# Patient Record
Sex: Male | Born: 1955 | Race: Black or African American | Hispanic: No | Marital: Single | Smoking: Current every day smoker
Health system: Southern US, Community
[De-identification: ages and names within clinical notes are randomized; demographics above are authoritative.]

## PROBLEM LIST (undated history)

## (undated) DIAGNOSIS — D649 Anemia, unspecified: Secondary | ICD-10-CM

## (undated) DIAGNOSIS — F209 Schizophrenia, unspecified: Secondary | ICD-10-CM

## (undated) DIAGNOSIS — J42 Unspecified chronic bronchitis: Secondary | ICD-10-CM

## (undated) HISTORY — PX: KNEE SURGERY: SHX244

## (undated) HISTORY — DX: Unspecified chronic bronchitis: J42

## (undated) HISTORY — DX: Anemia, unspecified: D64.9

## (undated) HISTORY — DX: Schizophrenia, unspecified: F20.9

---

## 2020-10-05 ENCOUNTER — Other Ambulatory Visit: Payer: Self-pay

## 2020-10-05 ENCOUNTER — Ambulatory Visit (HOSPITAL_COMMUNITY)
Admission: RE | Admit: 2020-10-05 | Discharge: 2020-10-05 | Disposition: A | Discharge status: Home / Self Care | Payer: Medicare Other | Source: Ambulatory Visit | Attending: Gerontology | Admitting: Gerontology

## 2020-10-05 ENCOUNTER — Other Ambulatory Visit (HOSPITAL_COMMUNITY): Payer: Self-pay | Admitting: Gerontology

## 2020-10-05 DIAGNOSIS — J410 Simple chronic bronchitis: Secondary | ICD-10-CM | POA: Diagnosis not present

## 2020-11-06 NOTE — Addendum Note (Signed)
Encounter addended by: Novella Olive on: 11/06/2020 4:26 PM  Actions taken: Letter saved

## 2020-11-11 ENCOUNTER — Encounter: Payer: Self-pay | Admitting: Internal Medicine

## 2020-11-12 ENCOUNTER — Other Ambulatory Visit (HOSPITAL_COMMUNITY): Payer: Self-pay | Admitting: Gerontology

## 2020-11-12 ENCOUNTER — Other Ambulatory Visit: Payer: Self-pay | Admitting: Gerontology

## 2020-11-12 DIAGNOSIS — R9389 Abnormal findings on diagnostic imaging of other specified body structures: Secondary | ICD-10-CM

## 2020-11-17 ENCOUNTER — Ambulatory Visit (HOSPITAL_BASED_OUTPATIENT_CLINIC_OR_DEPARTMENT_OTHER): Payer: Medicare Other

## 2020-11-23 ENCOUNTER — Ambulatory Visit (HOSPITAL_BASED_OUTPATIENT_CLINIC_OR_DEPARTMENT_OTHER)
Admission: RE | Admit: 2020-11-23 | Discharge: 2020-11-23 | Disposition: A | Discharge status: Home / Self Care | Payer: Medicare Other | Source: Ambulatory Visit | Attending: Gerontology | Admitting: Gerontology

## 2020-11-23 ENCOUNTER — Other Ambulatory Visit: Payer: Self-pay

## 2020-11-23 DIAGNOSIS — R9389 Abnormal findings on diagnostic imaging of other specified body structures: Secondary | ICD-10-CM

## 2020-11-23 DIAGNOSIS — R911 Solitary pulmonary nodule: Secondary | ICD-10-CM | POA: Diagnosis not present

## 2020-12-23 ENCOUNTER — Other Ambulatory Visit: Payer: Self-pay

## 2020-12-23 ENCOUNTER — Encounter: Payer: Self-pay | Admitting: Nurse Practitioner

## 2020-12-23 ENCOUNTER — Telehealth: Payer: Self-pay

## 2020-12-23 ENCOUNTER — Ambulatory Visit (INDEPENDENT_AMBULATORY_CARE_PROVIDER_SITE_OTHER): Payer: Medicare Other | Admitting: Nurse Practitioner

## 2020-12-23 DIAGNOSIS — Z79899 Other long term (current) drug therapy: Secondary | ICD-10-CM

## 2020-12-23 DIAGNOSIS — Z1211 Encounter for screening for malignant neoplasm of colon: Secondary | ICD-10-CM | POA: Diagnosis not present

## 2020-12-23 DIAGNOSIS — Z01818 Encounter for other preprocedural examination: Secondary | ICD-10-CM | POA: Diagnosis not present

## 2020-12-23 DIAGNOSIS — Z7982 Long term (current) use of aspirin: Secondary | ICD-10-CM

## 2020-12-23 DIAGNOSIS — Z0000 Encounter for general adult medical examination without abnormal findings: Secondary | ICD-10-CM

## 2020-12-23 MED ORDER — PEG 3350-KCL-NA BICARB-NACL 420 G PO SOLR: 4000.0000 mL | ORAL | 0 refills | Status: AC

## 2020-12-23 NOTE — Patient Instructions (Signed)
Your health issues we discussed today were:   Need for colonoscopy: 1. We will schedule your colonoscopy for you 2. Further recommendations will follow your colonoscopy 3. Call if there is any questions or problems with the bowel prep  Overall I recommend:  1. Continue other current medications 2. Return for follow-up based on recommendations made after your procedure, or as needed for GI symptoms 3. Call us if you have any questions or concerns   ---------------------------------------------------------------  I am glad you have gotten your COVID-19 vaccination!  Even though you are fully vaccinated you should continue to follow CDC and state/local guidelines.  ---------------------------------------------------------------   At [facility] we value your feedback. You may receive a survey about your visit today. Please share your experience as we strive to create trusting relationships with our patients to provide genuine, compassionate, quality care.  We appreciate your understanding and patience as we review any laboratory studies, imaging, and other diagnostic tests that are ordered as we care for you. Our office policy is 5 business days for review of these results, and any emergent or urgent results are addressed in a timely manner for your best interest. If you do not hear from our office in 1 week, please contact us.   We also encourage the use of MyChart, which contains your medical information for your review as well. If you are not enrolled in this feature, an access code is on this after visit summary for your convenience. Thank you for allowing [place] to be involved in your care.  It was great to see you today!  I hope you have a great summer!!

## 2020-12-23 NOTE — Progress Notes (Signed)
Cc'ed to pcp °

## 2020-12-23 NOTE — Telephone Encounter (Signed)
Called [facility], spoke to [place], [place] w/Propofol ASA 2 with Dr. Marletta Lor scheduled for 01/28/21 at 11:00am. Pt to arrive at 8:00am for rapid covid test prior to procedure d/t being a facility patient. Rx for prep sent to pharmacy. Orders entered. Procedure instructions faxed to [facility] (fax# [phone]).

## 2020-12-23 NOTE — Progress Notes (Signed)
Primary Care Physician:  Louis Fire, MD Primary Gastroenterologist:  Dr. Abbey Chatters  Chief Complaint  Patient presents with  . Consult    Never had TCS done prior.    HPI:   Louis Riley is a 65 y.o. male who presents on referral from primary care to schedule colonoscopy.  Nurse/phone triage was deferred office visit due to medications likely necessitating augmented sedation.  No history of colonoscopy found in our system.  Today he states he is doing okay overall. Denies abdominal pain, N/V, hematochezia, melena, fever, chills, unintentional weight loss. Denies URI or flu-like symptoms. Denies loss of sense of taste or smell. The patient has received COVID-19 vaccination(s). Denies chest pain, dyspnea, dizziness, lightheadedness, syncope, near syncope. Denies any other upper or lower GI symptoms.   Past Medical History:  Diagnosis Date  . Anemia   . Chronic bronchitis ([place])   . Schizophrenia ([place])     History reviewed. No pertinent surgical history.  Current Outpatient Medications  Medication Sig Dispense Refill  . ASPIRIN LOW DOSE 81 MG EC tablet Take 81 mg by mouth daily.    . benztropine (COGENTIN) 1 MG tablet Take 1 mg by mouth 2 (two) times daily.    . brimonidine (ALPHAGAN) 0.2 % ophthalmic solution 1 drop both eyes twice a day    . FEROSUL 325 (65 Fe) MG tablet Take by mouth. 1 tablet daily    . haloperidol (HALDOL) 2 MG/ML solution Take by mouth. 75m (280m by mouth 3 times a day    . melatonin 5 MG TABS Take 5 mg by mouth at bedtime.    . risperidone (RISPERDAL) 4 MG tablet Take 4 mg by mouth at bedtime.    . VENTOLIN HFA 108 (90 Base) MCG/ACT inhaler Inhale 2 puffs into the lungs every 4 (four) hours as needed.     No current facility-administered medications for this visit.    Allergies as of 12/23/2020  . (No Known Allergies)    History reviewed. No pertinent family history.  Social History   Socioeconomic History  . Marital status: Single     Spouse name: Not on file  . Number of children: Not on file  . Years of education: Not on file  . Highest education level: Not on file  Occupational History  . Not on file  Tobacco Use  . Smoking status: Current Every Day Smoker    Packs/day: 0.25    Types: Cigarettes  . Smokeless tobacco: Never Used  Substance and Sexual Activity  . Alcohol use: Never  . Drug use: Never  . Sexual activity: Not on file  Other Topics Concern  . Not on file  Social History Narrative  . Not on file   Social Determinants of Health   Financial Resource Strain: Not on file  Food Insecurity: Not on file  Transportation Needs: Not on file  Physical Activity: Not on file  Stress: Not on file  Social Connections: Not on file  Intimate Partner Violence: Not on file    Subjective: Review of Systems  Constitutional: Negative for chills, fever, malaise/fatigue and weight loss.  HENT: Negative for congestion and sore throat.   Respiratory: Negative for cough and shortness of breath.   Cardiovascular: Negative for chest pain and palpitations.  Gastrointestinal: Negative for abdominal pain, blood in stool, constipation, diarrhea, heartburn, melena, nausea and vomiting.  Musculoskeletal: Negative for joint pain and myalgias.  Skin: Negative for rash.  Neurological: Negative for dizziness and weakness.  Endo/Heme/Allergies: Does  not bruise/bleed easily.  Psychiatric/Behavioral: Negative for depression. The patient is not nervous/anxious.   All other systems reviewed and are negative.      Objective: BP 92/68   Pulse (!) 103   Temp (!) 96.9 F (36.1 C)   Ht _0  (1.88 m)   Wt 166 lb 3.2 oz (75.4 kg)   BMI 21.34 kg/m  Physical Exam Vitals and nursing note reviewed.  Constitutional:      General: He is not in acute distress.    Appearance: Normal appearance. He is normal weight. He is not ill-appearing, toxic-appearing or diaphoretic.  HENT:     Head: Normocephalic and atraumatic.     Nose:  No congestion or rhinorrhea.  Eyes:     General: No scleral icterus. Cardiovascular:     Rate and Rhythm: Normal rate and regular rhythm.     Heart sounds: Normal heart sounds.  Pulmonary:     Effort: Pulmonary effort is normal.     Breath sounds: Normal breath sounds.  Abdominal:     General: Bowel sounds are normal. There is no distension.     Palpations: Abdomen is soft. There is no hepatomegaly, splenomegaly or mass.     Tenderness: There is no abdominal tenderness. There is no guarding or rebound.     Hernia: No hernia is present.  Musculoskeletal:     Cervical back: Neck supple.  Skin:    General: Skin is warm and dry.     Coloration: Skin is not jaundiced.     Findings: No bruising or rash.  Neurological:     General: No focal deficit present.     Mental Status: He is alert and oriented to person, place, and time. Mental status is at baseline.  Psychiatric:        Mood and Affect: Mood normal.        Speech: Speech is rapid and pressured.        Behavior: Behavior normal.        Thought Content: Thought content normal.     Comments: Pleasant male, difficult to understand speech      Assessment:  Very pleasant 65 year old male who presents on referral from primary care to schedule colonoscopy.  History of schizophrenia currently living at [facility].  He is generally asymptomatic from a GI standpoint.  Due to medications we will need to proceed with propofol.  Further recommendations will follow his colonoscopy   Proceed with TCS on propofol/MAC with Dr. Abbey Chatters on propofol/MAC in near future: the risks, benefits, and alternatives have been discussed with the patient in detail. The patient states understanding and desires to proceed.  ASA II  The patient is currently on iron which we will hold for 1 week prior to his procedure.   Plan: 1. Colonoscopy as described above 2. Further recommendations to follow colonoscopy 3. Follow-up based on post  procedure recommendations or as needed for GI symptoms    Thank you for allowing [place] to participate in the care of [facility]  [name], DNP, AGNP-C Adult & Gerontological Nurse Practitioner [facility]   12/23/2020 10:34 AM   Disclaimer: This note was dictated with voice recognition software. Similar sounding words can inadvertently be transcribed and may not be corrected upon review.

## 2021-01-26 ENCOUNTER — Telehealth: Payer: Self-pay | Admitting: *Deleted

## 2021-01-26 NOTE — Progress Notes (Signed)
Called [facility] to notify them that patient does not need to arrive early for a covid test. Stacy at [facility] said they need to cancel procedure as they did not stop patient's iron. Mindy at [facility] notified.

## 2021-01-26 NOTE — Telephone Encounter (Signed)
Received VM from [place] that patient's Iron was not held for procedure scheduled for Friday 6/3 with Dr. Marletta Lor.  Called Nettie Elm at [phone] but line rings busy multiple times. Endo aware to place orders in depot for now.

## 2021-01-27 NOTE — Telephone Encounter (Signed)
Called back and line rang multiple times, no answer.

## 2021-01-28 ENCOUNTER — Other Ambulatory Visit (HOSPITAL_COMMUNITY): Admission: RE | Admit: 2021-01-28 | Payer: Medicare Other | Source: Ambulatory Visit

## 2021-01-28 ENCOUNTER — Ambulatory Visit (HOSPITAL_COMMUNITY): Admission: RE | Admit: 2021-01-28 | Payer: Medicare Other | Source: Home / Self Care

## 2021-01-28 ENCOUNTER — Telehealth: Payer: Self-pay | Admitting: Internal Medicine

## 2021-01-28 ENCOUNTER — Encounter (HOSPITAL_COMMUNITY): Admission: RE | Payer: Self-pay | Source: Home / Self Care

## 2021-01-28 SURGERY — COLONOSCOPY WITH PROPOFOL
Anesthesia: Monitor Anesthesia Care

## 2021-01-28 NOTE — Telephone Encounter (Signed)
Nettie Elm from [facility] called to schedule patient's procedure. Please call [phone]

## 2021-01-31 NOTE — Telephone Encounter (Signed)
See other phone note for today. 

## 2021-01-31 NOTE — Telephone Encounter (Signed)
Called [facility], [place] wasn't available. Spoke to [place]. TCS rescheduled to 02/21/21 at 9:15am. Facility has prep. Instructions faxed to [phone]. Orders entered.

## 2021-02-18 ENCOUNTER — Encounter (HOSPITAL_COMMUNITY): Payer: Self-pay | Admitting: Anesthesiology

## 2021-02-21 ENCOUNTER — Telehealth: Payer: Self-pay | Admitting: Internal Medicine

## 2021-02-21 ENCOUNTER — Ambulatory Visit (HOSPITAL_COMMUNITY): Admission: RE | Admit: 2021-02-21 | Payer: Medicare Other | Source: Home / Self Care

## 2021-02-21 ENCOUNTER — Encounter (HOSPITAL_COMMUNITY): Admission: RE | Payer: Self-pay | Source: Home / Self Care

## 2021-02-21 SURGERY — COLONOSCOPY WITH PROPOFOL
Anesthesia: Monitor Anesthesia Care

## 2021-02-21 NOTE — Telephone Encounter (Signed)
Noted. Wynne Dust NP seen pt at [facility]. Eric no longer with our practice.

## 2021-02-21 NOTE — Telephone Encounter (Signed)
Melanie from [facility] called to let us know that the patient was refusing his procedure this morning and it was cancelled.

## 2021-04-05 IMAGING — CT CT CHEST W/O CM
1 of 2 series · 14 of 32 positions shown, 18 images · non-contrast
Comparison: September 2020.

CLINICAL DATA: Abnormal chest x-ray.

EXAM:
CT CHEST WITHOUT CONTRAST
TECHNIQUE: Multidetector CT imaging of the chest was performed following the
standard protocol without IV contrast.

[Series 2: routine chest without · axial · non-contrast · 0.63mm/px · z∈[+1224,+1518]mm · 14 of 175 slices shown, 18 images]
[im 14/175  mediastinal]
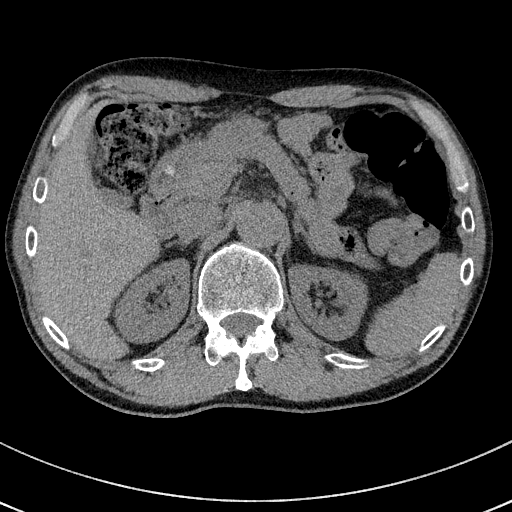
[im 14/175  lung]
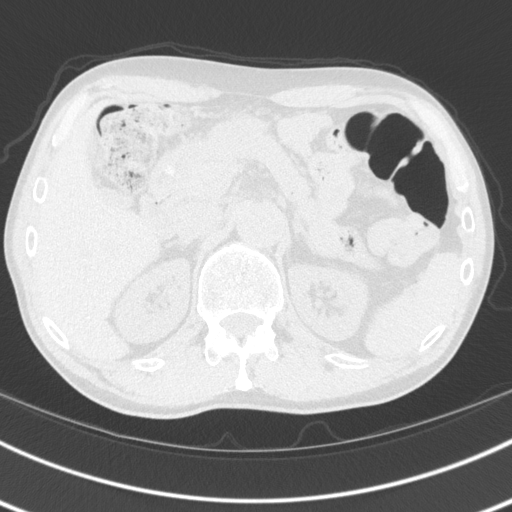
[im 27/175  lung]
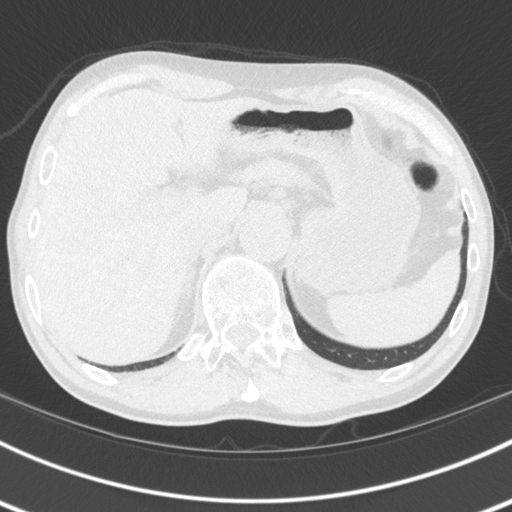
[im 41/175  lung]
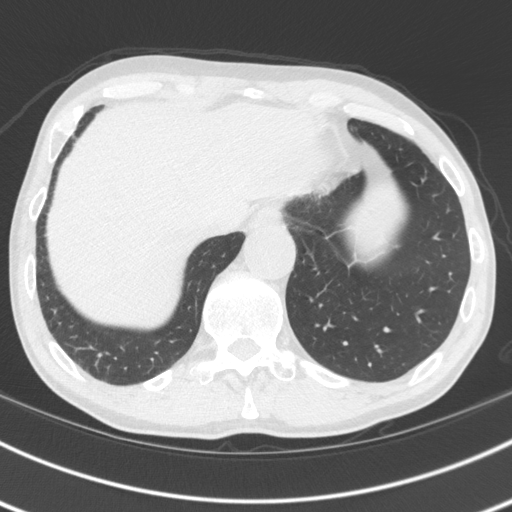
[im 54/175  lung]
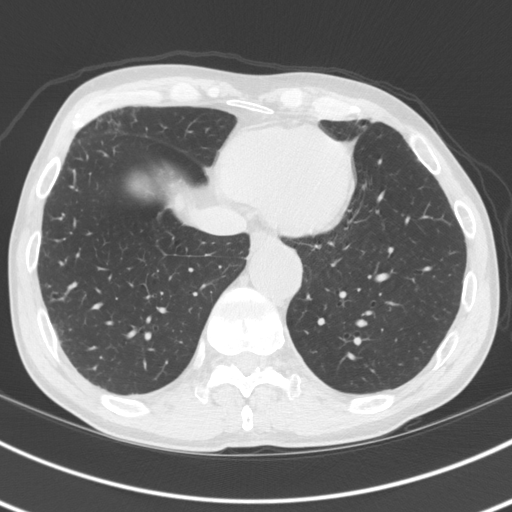
[im 67/175  mediastinal]
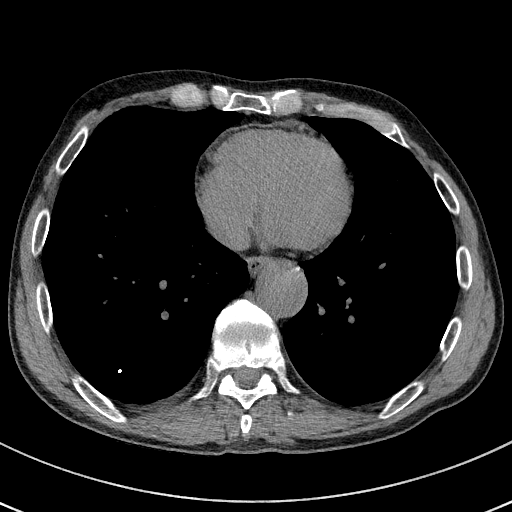
[im 67/175  lung]
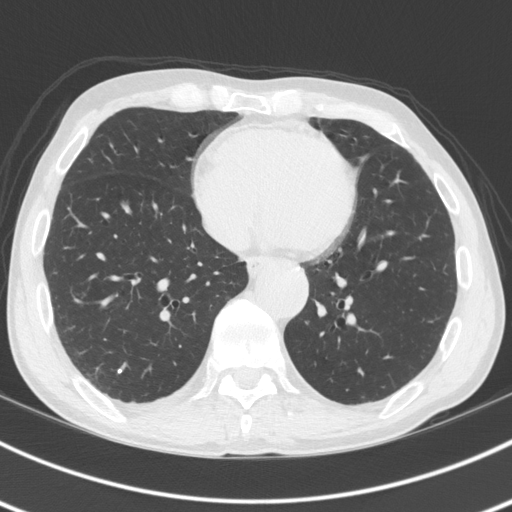
[im 81/175  lung]
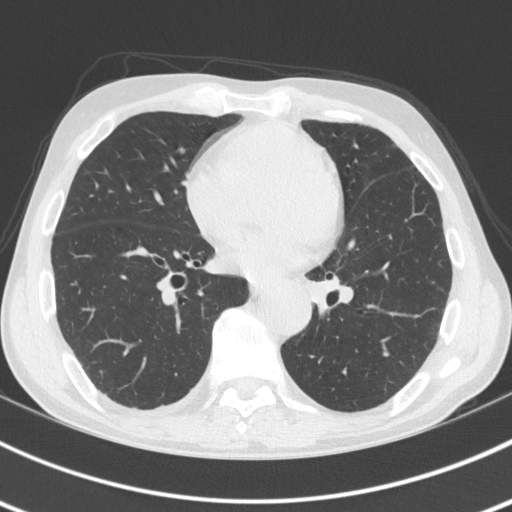
[im 83/175  lung]
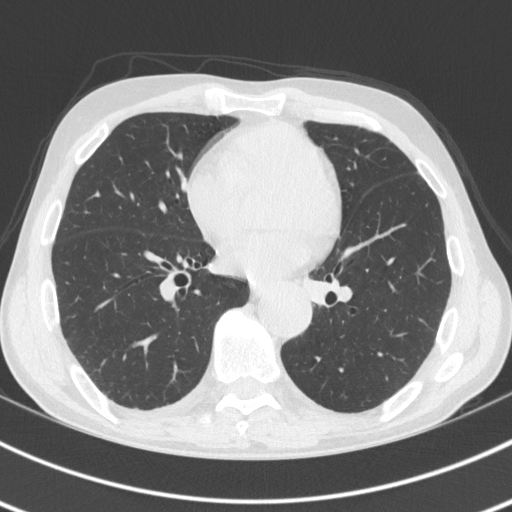
[im 88/175  lung]
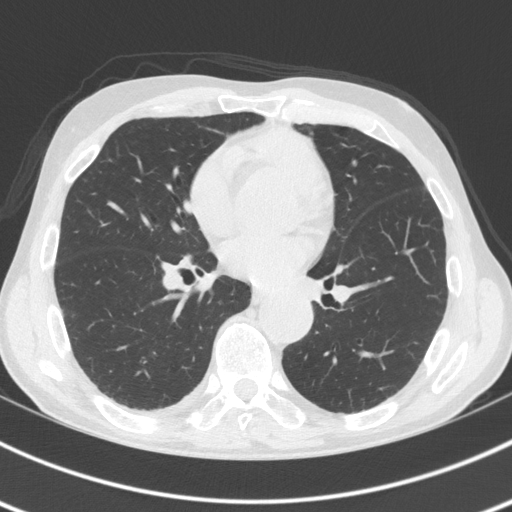
[im 94/175  mediastinal]
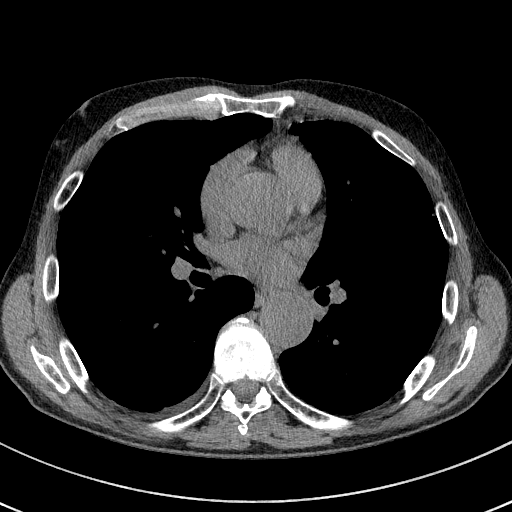
[im 94/175  lung]
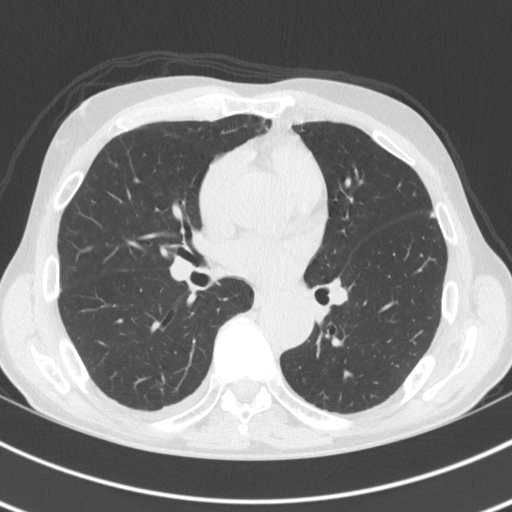
[im 108/175  lung]
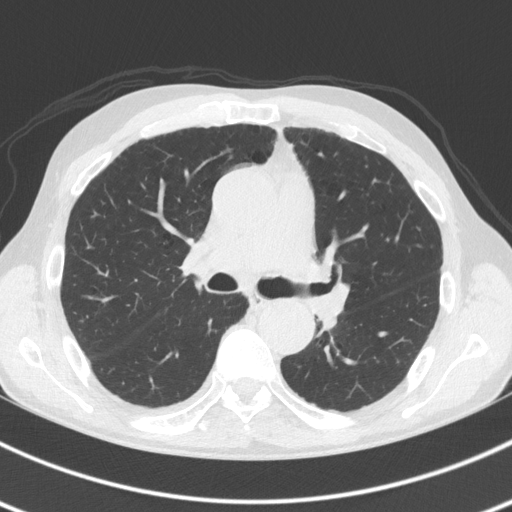
[im 121/175  lung]
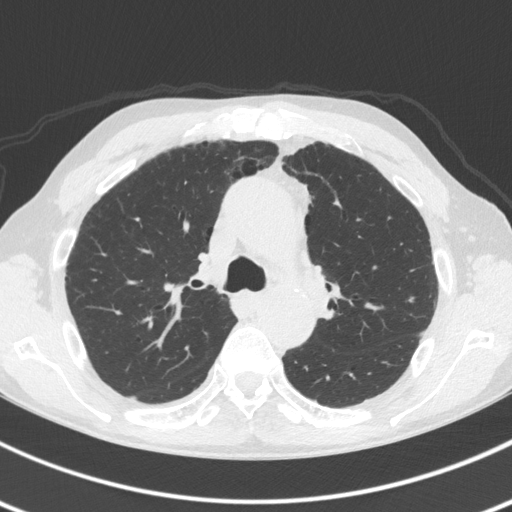
[im 134/175  lung]
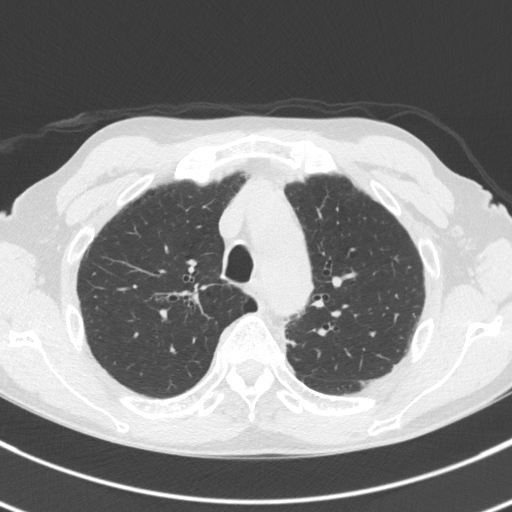
[im 148/175  mediastinal]
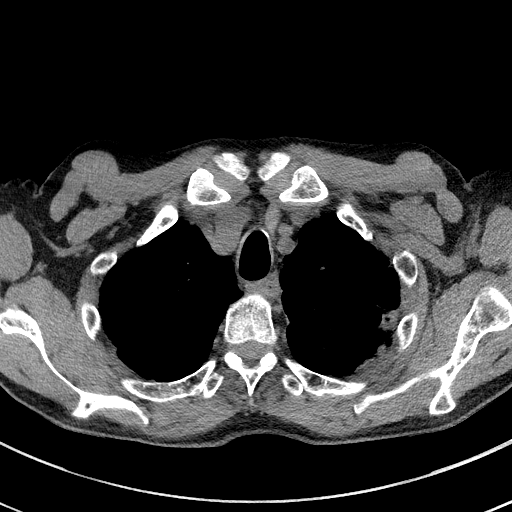
[im 148/175  lung]
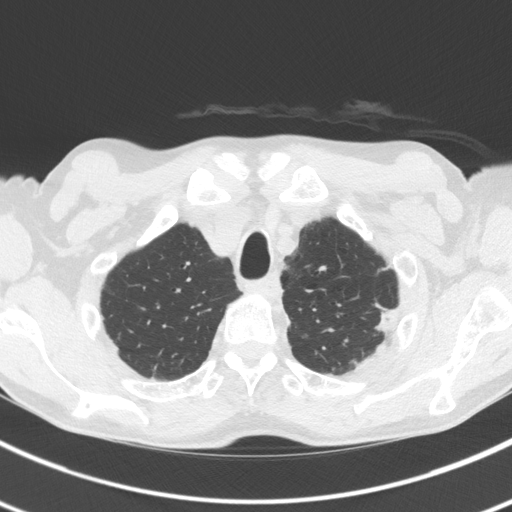
[im 161/175  lung]
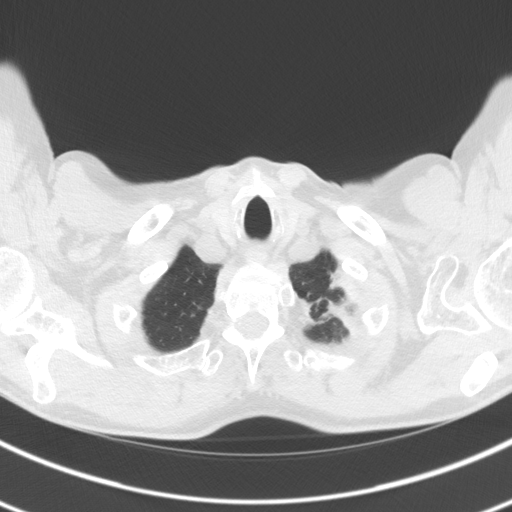

[14 of 32 positions shown; findings below may reference images not displayed]

FINDINGS: Cardiovascular: No significant vascular findings. Normal heart size.
No pericardial effusion.

Mediastinum/Nodes: No enlarged mediastinal or axillary lymph nodes.
Thyroid gland, trachea, and esophagus demonstrate no significant
findings.

Lungs/Pleura: No pneumothorax or pleural effusion is noted. Mild
biapical scarring is noted. 5 mm right apical nodule is noted best
seen on image number 19 of series 2.

Upper Abdomen: No acute abnormality.

Musculoskeletal: No chest wall mass or suspicious bone lesions
identified.
IMPRESSION: 5 mm right apical nodule is noted. No follow-up needed if patient is
low-risk. Non-contrast chest CT can be considered in 12 months if
patient is high-risk. This recommendation follows the consensus
statement: Guidelines for Management of Incidental Pulmonary Nodules
Detected on CT Images: From the [HOSPITAL] 5212; Radiology

## 2021-05-10 ENCOUNTER — Ambulatory Visit (HOSPITAL_COMMUNITY)
Admission: RE | Admit: 2021-05-10 | Discharge: 2021-05-10 | Disposition: A | Discharge status: Home / Self Care | Payer: Medicare Other | Source: Ambulatory Visit | Attending: Internal Medicine | Admitting: Internal Medicine

## 2021-05-10 ENCOUNTER — Other Ambulatory Visit: Payer: Self-pay

## 2021-05-10 DIAGNOSIS — R000 Tachycardia, unspecified: Secondary | ICD-10-CM | POA: Insufficient documentation

## 2021-07-18 ENCOUNTER — Other Ambulatory Visit: Payer: Self-pay

## 2021-07-18 ENCOUNTER — Encounter: Payer: Self-pay | Admitting: Cardiology

## 2021-07-18 ENCOUNTER — Ambulatory Visit (INDEPENDENT_AMBULATORY_CARE_PROVIDER_SITE_OTHER): Payer: Medicare Other | Admitting: Cardiology

## 2021-07-18 VITALS — BP 132/74 | HR 101 | Ht 73.0 in | Wt 160.0 lb

## 2021-07-18 DIAGNOSIS — I951 Orthostatic hypotension: Secondary | ICD-10-CM | POA: Diagnosis not present

## 2021-07-18 DIAGNOSIS — R9431 Abnormal electrocardiogram [ECG] [EKG]: Secondary | ICD-10-CM

## 2021-07-18 NOTE — Progress Notes (Signed)
Cardiology CONSULT Note    Date:  07/18/2021   ID:  Louis Riley, DOB 04-Nov-1955, MRN [id number]  PCP:  Avon Gully, MD  Cardiologist:  Armanda Magic, MD   Chief Complaint  Patient presents with   New Patient (Initial Visit)    Hypotension abnormal EKG    History of Present Illness:  Louis Riley is a 65 y.o. male who is being seen today for the evaluation of abnormal EKG and hypotension at the request of Avon Gully, MD.  This is a 65 year old male with a history of chronic anemia, chronic bronchitis and schizophrenia who resides at high [facility].  Recently he had some drops in his blood pressure and was found to have an abnormal EKG and is now referred here for further evaluation.  His EKG on 05/10/2021 showed normal sinus rhythm with PACs and right bundle branch block no old EKG to compare to. He is here with a caregiver today who provides most of the history.  He has been having episodic hypotension when his BP is checked in the am around the low 100's.  He says that he has not problems with dizziness but will feel tired.   He denies any chest pain or pressure, SOB, palpitations or LE edema.  He is a current smoker.  His mom died of cancer and he does not know his fathers hx.   Past Medical History:  Diagnosis Date   Anemia    Chronic bronchitis ([facility])    Schizophrenia ([facility])     Past Surgical History:  Procedure Laterality Date   KNEE SURGERY      Current Medications: Current Meds  Medication Sig   ASPIRIN LOW DOSE 81 MG EC tablet Take 81 mg by mouth daily.   benztropine (COGENTIN) 1 MG tablet Take 1 mg by mouth 2 (two) times daily.   bisacodyl (DULCOLAX) 5 MG EC tablet Take 10 mg by mouth See admin instructions. Take 10 mg by mouth at noon, then take 10 mg 1 hour after completing the solutions (per colon prep instructions)   brimonidine (ALPHAGAN) 0.2 % ophthalmic solution Place 1 drop into both eyes 2 (two) times daily.   FEROSUL 325 (65 Fe) MG  tablet Take 325 mg by mouth daily.   haloperidol (HALDOL) 2 MG/ML solution Take 2 mg by mouth 3 (three) times daily. 4ml   melatonin 5 MG TABS Take 10 mg by mouth at bedtime.   polyethylene glycol-electrolytes (TRILYTE) 420 g solution Take 4,000 mLs by mouth as directed.   risperidone (RISPERDAL) 4 MG tablet Take 4 mg by mouth daily.   Sodium Phosphates (ENEMA RE) Use per colon prep instructions   traZODone (DESYREL) 50 MG tablet Take 50 mg by mouth at bedtime.   VENTOLIN HFA 108 (90 Base) MCG/ACT inhaler Inhale 1 puff into the lungs 4 (four) times daily as needed for wheezing or shortness of breath.    Allergies:   Patient has no known allergies.   Social History   Socioeconomic History   Marital status: Single    Spouse name: Not on file   Number of children: Not on file   Years of education: Not on file   Highest education level: Not on file  Occupational History   Not on file  Tobacco Use   Smoking status: Every Day    Packs/day: 0.25    Types: Cigarettes   Smokeless tobacco: Never  Substance and Sexual Activity   Alcohol use: Never  Drug use: Never   Sexual activity: Not on file  Other Topics Concern   Not on file  Social History Narrative   Not on file   Social Determinants of Health   Financial Resource Strain: Not on file  Food Insecurity: Not on file  Transportation Needs: Not on file  Physical Activity: Not on file  Stress: Not on file  Social Connections: Not on file     Family History:  The patient's family history includes Cancer in his mother.   ROS:   Please see the history of present illness.    ROS All other systems reviewed and are negative.  No flowsheet data found.     PHYSICAL EXAM:   VS:  BP 132/74   Pulse (!) 101   Ht 6\' 1"  (1.854 m)   Wt 160 lb (72.6 kg)   SpO2 97%   BMI 21.11 kg/m    Orthostatic VS for the past 24 hrs (Last 3 readings):  BP- Lying Pulse- Lying BP- Sitting Pulse- Sitting BP- Standing at 0 minutes Pulse-  Standing at 0 minutes  07/18/21 1026 104/70 78 98/76 106 90/68 122     GEN: Well nourished, well developed, in no acute distress  HEENT: normal  Neck: no JVD, carotid bruits, or masses Cardiac: RRR; no murmurs, rubs, or gallops,no edema.  Intact distal pulses bilaterally.  Respiratory:  clear to auscultation bilaterally, normal work of breathing GI: soft, nontender, nondistended, + BS MS: no deformity or atrophy  Skin: warm and dry, no rash Neuro:  Alert and Oriented x 3, Strength and sensation are intact Psych: euthymic mood, full affect  Wt Readings from Last 3 Encounters:  07/18/21 160 lb (72.6 kg)  12/23/20 166 lb 3.2 oz (75.4 kg)      Studies/Labs Reviewed:   EKG:  EKG is not ordered today.    Recent Labs: No results found for requested labs within last 8760 hours.   Lipid Panel No results found for: CHOL, TRIG, HDL, CHOLHDL, VLDL, LDLCALC, LDLDIRECT  Additional studies/ records that were reviewed today include:  OV notes from PCP    ASSESSMENT:    1. Orthostatic hypotension   2. Nonspecific abnormal electrocardiogram (ECG) (EKG)      PLAN:  In order of problems listed above:  Hypotension -He is on multiple psychotropic drugs for his schizophrenia which may be contributing to his hypotension issues -He is orthostatic in the office today.  He is on multiple psychotropic drugs and I have reviewed these with our pharmacist.  She feels it is likely the risperidone which is contributing to his orthostatic hypotension -I will send a note to his primary physician to consider changing this medication -Will add thigh-high compression hose and abdominal binder to be worn during the day -Follow-up in 2 weeks with extender to see how he is doing.  If he is still having orthostasis then we will need to consider addition of midodrine  2.  Abnormal EKG -EKG done in September 2022 showed right bundle branch block with PACs but I do not have any old EKGs to compare to -He  is completely asymptomatic as far as no chest pain or shortness of breath -I will get a 2D echocardiogram to evaluate LV function -Lexiscan Myoview to rule out ischemia -Shared Decision Making/Informed Consent The risks [chest pain, shortness of breath, cardiac arrhythmias, dizziness, blood pressure fluctuations, myocardial infarction, stroke/transient ischemic attack, nausea, vomiting, allergic reaction, radiation exposure, metallic taste sensation and life-threatening complications (estimated to be  1 in 10,000)], benefits (risk stratification, diagnosing coronary artery disease, treatment guidance) and alternatives of a nuclear stress test were discussed in detail with Mr. Mathey and he agrees to proceed.  Time Spent: 25 minutes total time of encounter, including 20 minutes spent in face-to-face patient care on the date of this encounter. This time includes coordination of care and counseling regarding above mentioned problem list. Remainder of non-face-to-face time involved reviewing chart documents/testing relevant to the patient encounter and documentation in the medical record. I have independently reviewed documentation from referring provider  Medication Adjustments/Labs and Tests Ordered: Current medicines are reviewed at length with the patient today.  Concerns regarding medicines are outlined above.  Medication changes, Labs and Tests ordered today are listed in the Patient Instructions below.  There are no Patient Instructions on file for this visit.   Signed, Armanda Magic, MD  07/18/2021 10:16 AM    [facility] [address] [place], [place], [place]  [address] Phone: [phone]; Fax: [phone]

## 2021-07-18 NOTE — Patient Instructions (Signed)
Dr. Mayford Knife would like for you to wear thigh high compression hose and an abdominal binder daily.  Medication Instructions:  Your physician recommends that you continue on your current medications as directed. Please refer to the Current Medication list given to you today.  *If you need a refill on your cardiac medications before your next appointment, please call your pharmacy*  Follow-Up: At [facility], you and your health needs are our priority.  As part of our continuing mission to provide you with exceptional heart care, we have created designated Provider Care Teams.  These Care Teams include your primary Cardiologist (physician) and Advanced Practice Providers (APPs -  Physician Assistants and Nurse Practitioners) who all work together to provide you with the care you need, when you need it.   Your next appointment:   2-3 week(s)  The format for your next appointment:   In Person  Provider:   You will see one of the following Advanced Practice Providers on your designated Care Team:   [place], PA-C  Jacolyn Reedy, PA-C   1}

## 2021-07-23 NOTE — Progress Notes (Signed)
Cardiology Office Note:    Date:  08/01/2021   ID:  Louis Riley, DOB 02-Aug-1956, MRN [id number]  PCP:  Avon Gully, MD   [facility] Providers Cardiologist:  None {  Referring MD: Avon Gully, MD    History of Present Illness:    Louis Riley is a 65 y.o. male with a hx of chronic anemia, chronic bronchitis, and schizophrenia who was recently seen by Dr. Mayford Knife found an abnormal ECG who now presents to clinic for follow-up.  Patient was seen by Armanda Magic, MD on 07/18/21 for abnormal ECG. Specifically ECG showed NSR with PACs and RBBB. Was also having episodes of hypotension at home. No dizziness but had associated fatigue. Notably was orthostatic during that visit thought to be due to psychotropic medications. Was recommended to switch off risperidone if possible. Recommended compression hose and abdominal binder. He was also recommended for TTE and myoview but those were not scheduled.  He returns today where he states his symptoms are overall improved. Blood pressure is still notably low at 90s/70s. Remains on risperidone (likely could not be weaned off). Continues to have intermittent dizziness but no syncope. No chest pain, SOB, orthopnea or PND. He is active and walks outside without anginal symptoms. Has been wearing the abdominal binder and compression stockings, however, the abdominal binder is loose on exam and the compression socks are too big. We discussed this with his health care aide today as well and will work on trying to get him better fitting stockings and ensure his abdominal binder is tighter.  Orthostatics negative today  Past Medical History:  Diagnosis Date   Anemia    Chronic bronchitis (HCC)    Schizophrenia (HCC)     Past Surgical History:  Procedure Laterality Date   KNEE SURGERY      Current Medications: Current Meds  Medication Sig   ASPIRIN LOW DOSE 81 MG EC tablet Take 81 mg by mouth daily.   benztropine (COGENTIN) 1 MG tablet Take 1  mg by mouth 2 (two) times daily.   brimonidine (ALPHAGAN) 0.2 % ophthalmic solution Place 1 drop into both eyes 2 (two) times daily.   FEROSUL 325 (65 Fe) MG tablet Take 325 mg by mouth daily.   haloperidol (HALDOL) 2 MG/ML solution Take 2 mg by mouth 3 (three) times daily. 31ml   melatonin 5 MG TABS Take 10 mg by mouth at bedtime.   risperidone (RISPERDAL) 4 MG tablet Take 4 mg by mouth daily.   traZODone (DESYREL) 50 MG tablet Take 50 mg by mouth at bedtime.   VENTOLIN HFA 108 (90 Base) MCG/ACT inhaler Inhale 1 puff into the lungs 4 (four) times daily as needed for wheezing or shortness of breath.     Allergies:   Patient has no known allergies.   Social History   Socioeconomic History   Marital status: Single    Spouse name: Not on file   Number of children: Not on file   Years of education: Not on file   Highest education level: Not on file  Occupational History   Not on file  Tobacco Use   Smoking status: Every Day    Packs/day: 0.25    Types: Cigarettes   Smokeless tobacco: Never  Substance and Sexual Activity   Alcohol use: Never   Drug use: Never   Sexual activity: Not on file  Other Topics Concern   Not on file  Social History Narrative   Not on file   Social Determinants of  Health   Financial Resource Strain: Not on file  Food Insecurity: Not on file  Transportation Needs: Not on file  Physical Activity: Not on file  Stress: Not on file  Social Connections: Not on file     Family History: The patient's family history includes Cancer in his mother.  ROS:   Please see the history of present illness.    Review of Systems  Constitutional:  Positive for malaise/fatigue.  Respiratory:  Negative for shortness of breath.   Cardiovascular:  Negative for chest pain, palpitations, orthopnea, claudication, leg swelling and PND.  Neurological:  Positive for dizziness. Negative for loss of consciousness.    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  Recent Lipid Panel No results found for: CHOL, TRIG, HDL, CHOLHDL, VLDL, LDLCALC, LDLDIRECT   Risk Assessment/Calculations:           Physical Exam:    VS:  BP 98/70   Pulse 90   Ht 6\' 2"  (1.88 m)   Wt 164 lb (74.4 kg)   SpO2 99%   BMI 21.06 kg/m     Wt Readings from Last 3 Encounters:  08/01/21 164 lb (74.4 kg)  07/18/21 160 lb (72.6 kg)  12/23/20 166 lb 3.2 oz (75.4 kg)     GEN: Comfortable, NAD HEENT: Normal NECK: No JVD; No carotid bruits CARDIAC: RRR, no murmurs, rubs, gallops RESPIRATORY:  Clear to auscultation without rales, wheezing or rhonchi  ABDOMEN: Soft, non-tender, non-distended. Abdominal binder in place with loose fit MUSCULOSKELETAL:  No edema; No deformity  SKIN: Warm and dry NEUROLOGIC:  AAOx3, stuttering of speech PSYCHIATRIC:  Alert and calm  ASSESSMENT:    1. Orthostatic hypotension   2. Nonspecific abnormal electrocardiogram (ECG) (EKG)   3. Schizophrenia, unspecified type (HCC)    PLAN:    In order of problems listed above:  #Orthostatic Hypotension: Thought to be secondary to psychotropic medications and was notably orthostatic in office on 07/18/21. Was recommended for compression stockings and abdominal binder at that time as well as weaning off risperidone if possible. The patient remains on risperidone today and likely due to his underlying psychiatric conditions, will unable to be weaned off per report. He is wearing his compression socks but they are ill-fitting and do not provide any support. He is also wearing his abdominal binder but again, it is too loose. Discussed with his health aide and with the patient that we need to ensure the compression garments are tight (not uncomfortably so but just enough to provide support). Also encouraged him to increase his fluid intake  and liberalize salt intake. -Orthostatics today negative today -Discussed importance of tight fitting compression socks and abdominal binder -Increase hydration -Liberalize salt intake  #Abnormal ECG: ECG with NSR, PACs and RBBB. Asymptomatic. Recommended for myoview and TTE per Dr. 07/20/21. Has not been scheduled yet. Will schedule TTE today and pursue myoview if abnormal. -Check TTE -If TTE concerning, will pursue myoview  #Schizophrenia: -Management per psych -Likely psychotropic meds are contributing to orthostasis, however, unlikely that he can be weaned off. Will try conservative measures for orthostasis at this time as detailed above      Medication Adjustments/Labs and Tests Ordered: Current medicines are reviewed at length with the patient today.  Concerns regarding medicines are outlined above.  Orders Placed This Encounter  Procedures  ECHOCARDIOGRAM COMPLETE   No orders of the defined types were placed in this encounter.   Patient Instructions  Medication Instructions:  Your physician recommends that you continue on your current medications as directed. Please refer to the Current Medication list given to you today.  *If you need a refill on your cardiac medications before your next appointment, please call your pharmacy*   Lab Work: None If you have labs (blood work) drawn today and your tests are completely normal, you will receive your results only by: MyChart Message (if you have MyChart) OR A paper copy in the mail If you have any lab test that is abnormal or we need to change your treatment, we will call you to review the results.   Testing/Procedures: Your physician has requested that you have an echocardiogram. Echocardiography is a painless test that uses sound waves to create images of your heart. It provides your doctor with information about the size and shape of your heart and how well your heart's chambers and valves are working. This procedure  takes approximately one hour. There are no restrictions for this procedure.    Follow-Up: At [facility], you and your health needs are our priority.  As part of our continuing mission to provide you with exceptional heart care, we have created designated Provider Care Teams.  These Care Teams include your primary Cardiologist (physician) and Advanced Practice Providers (APPs -  Physician Assistants and Nurse Practitioners) who all work together to provide you with the care you need, when you need it.  We recommend signing up for the patient portal called "MyChart".  Sign up information is provided on this After Visit Summary.  MyChart is used to connect with patients for Virtual Visits (Telemedicine).  Patients are able to view lab/test results, encounter notes, upcoming appointments, etc.  Non-urgent messages can be sent to your provider as well.   To learn more about what you can do with MyChart, go to [email or web address].    Your next appointment:   3 month(s)  The format for your next appointment:   In Person  Provider:   Laurance Flatten, MD     Other Instructions COMPRESSION STOCKINGS- THESE SHOULD BE THIGH HIGH AND TIGHT CONTINUE ABDOMINAL BINDER INCREASE FLUID INTAKE WITH: -WATER -POWERADE ZERO -GATORADE ZERO   Signed, Meriam Sprague, MD  08/01/2021 2:56 PM    [facility]

## 2021-08-01 ENCOUNTER — Ambulatory Visit (INDEPENDENT_AMBULATORY_CARE_PROVIDER_SITE_OTHER): Payer: Medicare Other | Admitting: Cardiology

## 2021-08-01 ENCOUNTER — Telehealth: Payer: Self-pay | Admitting: Cardiology

## 2021-08-01 ENCOUNTER — Other Ambulatory Visit: Payer: Self-pay

## 2021-08-01 VITALS — BP 98/70 | HR 90 | Ht 74.0 in | Wt 164.0 lb

## 2021-08-01 DIAGNOSIS — I951 Orthostatic hypotension: Secondary | ICD-10-CM | POA: Diagnosis not present

## 2021-08-01 DIAGNOSIS — F209 Schizophrenia, unspecified: Secondary | ICD-10-CM | POA: Diagnosis not present

## 2021-08-01 DIAGNOSIS — R9431 Abnormal electrocardiogram [ECG] [EKG]: Secondary | ICD-10-CM | POA: Diagnosis not present

## 2021-08-01 NOTE — Telephone Encounter (Signed)
Per Dr. Devin Going note from 07/31/21,  Has been wearing the abdominal binder and compression stockings, however, the abdominal binder is loose on exam and the compression socks are too big. We discussed this with his health care aide today as well and will work on trying to get him better fitting stockings and ensure his abdominal binder is tighter.   Called the number provided and after ringing several times.. unable to leave a message.   Will try again at a later time.

## 2021-08-01 NOTE — Patient Instructions (Signed)
Medication Instructions:  Your physician recommends that you continue on your current medications as directed. Please refer to the Current Medication list given to you today.  *If you need a refill on your cardiac medications before your next appointment, please call your pharmacy*   Lab Work: None If you have labs (blood work) drawn today and your tests are completely normal, you will receive your results only by: MyChart Message (if you have MyChart) OR A paper copy in the mail If you have any lab test that is abnormal or we need to change your treatment, we will call you to review the results.   Testing/Procedures: Your physician has requested that you have an echocardiogram. Echocardiography is a painless test that uses sound waves to create images of your heart. It provides your doctor with information about the size and shape of your heart and how well your heart's chambers and valves are working. This procedure takes approximately one hour. There are no restrictions for this procedure.    Follow-Up: At [facility], you and your health needs are our priority.  As part of our continuing mission to provide you with exceptional heart care, we have created designated Provider Care Teams.  These Care Teams include your primary Cardiologist (physician) and Advanced Practice Providers (APPs -  Physician Assistants and Nurse Practitioners) who all work together to provide you with the care you need, when you need it.  We recommend signing up for the patient portal called "MyChart".  Sign up information is provided on this After Visit Summary.  MyChart is used to connect with patients for Virtual Visits (Telemedicine).  Patients are able to view lab/test results, encounter notes, upcoming appointments, etc.  Non-urgent messages can be sent to your provider as well.   To learn more about what you can do with MyChart, go to [email or web address].    Your next appointment:   3  month(s)  The format for your next appointment:   In Person  Provider:   Laurance Flatten, MD     Other Instructions COMPRESSION STOCKINGS- THESE SHOULD BE THIGH HIGH AND TIGHT CONTINUE ABDOMINAL BINDER INCREASE FLUID INTAKE WITH: -WATER -POWERADE ZERO -GATORADE ZERO

## 2021-08-01 NOTE — Telephone Encounter (Signed)
Nettie Elm from [facility] calling because the patient was prescribed a binder and thigh highs, but he had to pay out of pocket for them. She says his thigh highs were falling down his legs so he was ordered another pair, but he does not have the money for it. She states the patient's aid also said something about compression pants, but he does not have the money to buy anything like that right now. She says she leaves in a few minutes so if she is not available her manager has been filled in and can take the call. Phone: [phone]

## 2021-08-03 NOTE — Telephone Encounter (Signed)
Will route this message to our [place] office for further follow-up of this matter. Pt is established in our [place] office and future appts there.

## 2021-08-03 NOTE — Telephone Encounter (Signed)
Will reach out to SW and see if there is any funding that would help patient.

## 2021-08-03 NOTE — Telephone Encounter (Signed)
Can you please provide me with the specifications of pt's compression stockings so we can get those ordered for him??  Thank you so much!

## 2021-08-04 ENCOUNTER — Telehealth: Payer: Self-pay | Admitting: Licensed Clinical Social Worker

## 2021-08-04 NOTE — Telephone Encounter (Signed)
Per [name], MD:  We wanted him to do thigh highs with 20-28mmHg compression if able. Sizing may be difficult as he is very thin but also tall at 6'2." If we need to, we can do knee high if that is easier to fit him.

## 2021-08-04 NOTE — Telephone Encounter (Signed)
CSW received referral to assist with obtaining compression stockings for patient who reported that he is unable to afford. Patient is a resident at [facility] and [place] staff member assisted in obtaining size for compression socks. CSW ordered on [employer] and shipped to patient's home. Nettie Elm will monitor mail and provide to patient when it arrives. CSW available as needed. Lasandra Beech, LCSW, CCSW-MCS [phone]

## 2021-09-09 ENCOUNTER — Other Ambulatory Visit: Payer: Self-pay

## 2021-09-09 ENCOUNTER — Ambulatory Visit (HOSPITAL_COMMUNITY)
Admission: RE | Admit: 2021-09-09 | Discharge: 2021-09-09 | Disposition: A | Discharge status: Home / Self Care | Payer: Medicare Other | Source: Ambulatory Visit | Attending: Cardiology | Admitting: Cardiology

## 2021-09-09 DIAGNOSIS — R9431 Abnormal electrocardiogram [ECG] [EKG]: Secondary | ICD-10-CM | POA: Diagnosis present

## 2021-09-09 LAB — ECHOCARDIOGRAM COMPLETE
AR max vel: 3.84 cm2
AV Area VTI: 3.94 cm2
AV Area mean vel: 4.5 cm2
AV Mean grad: 2 mmHg
AV Peak grad: 5.5 mmHg
Ao pk vel: 1.17 m/s
Area-P 1/2: 3.24 cm2
Calc EF: 52.5 %
MV VTI: 3.97 cm2
S' Lateral: 3.2 cm
Single Plane A2C EF: 54.2 %
Single Plane A4C EF: 54.2 %

## 2021-09-09 NOTE — Progress Notes (Signed)
*  PRELIMINARY RESULTS* Echocardiogram 2D Echocardiogram has been performed.  Carolyne Fiscal 09/09/2021, 2:47 PM

## 2021-11-03 NOTE — Progress Notes (Unsigned)
Cardiology Office Note:    Date:  11/03/2021   ID:  [name], DOB [birth year], MRN [id number]  PCP:  Carrolyn Meiers, MD   [facility] Providers Cardiologist:  None {  Referring MD: Carrolyn Meiers*    History of Present Illness:    Louis Riley is a 66 y.o. male with a hx of chronic anemia, chronic bronchitis, and schizophrenia who presents to clinic for follow-up.  Patient was seen by Fransico Him, MD on 07/18/21 for abnormal ECG. Specifically ECG showed NSR with PACs and RBBB. Was also having episodes of hypotension at home. No dizziness but had associated fatigue. Notably was orthostatic during that visit thought to be due to psychotropic medications. Was recommended to switch off risperidone if possible. Recommended compression hose and abdominal binder. He was also recommended for TTE and myoview but those were not scheduled.  He returns today where he states his symptoms are overall improved. Blood pressure is still notably low at 90s/70s. Remains on risperidone (likely could not be weaned off). Continues to have intermittent dizziness but no syncope. No chest pain, SOB, orthopnea or PND. He is active and walks outside without anginal symptoms. Has been wearing the abdominal binder and compression stockings, however, the abdominal binder is loose on exam and the compression socks are too big. We discussed this with his health care aide today as well and will work on trying to get him better fitting stockings and ensure his abdominal binder is tighter.  Orthostatics negative today  Past Medical History:  Diagnosis Date   Anemia    Chronic bronchitis (HCC)    Schizophrenia (HCC)     Past Surgical History:  Procedure Laterality Date   KNEE SURGERY      Current Medications: No outpatient medications have been marked as taking for the 11/08/21 encounter (Appointment) with Freada Bergeron, MD.     Allergies:   Patient has no known allergies.    Social History   Socioeconomic History   Marital status: Single    Spouse name: Not on file   Number of children: Not on file   Years of education: Not on file   Highest education level: Not on file  Occupational History   Not on file  Tobacco Use   Smoking status: Every Day    Packs/day: 0.25    Types: Cigarettes   Smokeless tobacco: Never  Substance and Sexual Activity   Alcohol use: Never   Drug use: Never   Sexual activity: Not on file  Other Topics Concern   Not on file  Social History Narrative   Not on file   Social Determinants of Health   Financial Resource Strain: Not on file  Food Insecurity: Not on file  Transportation Needs: Not on file  Physical Activity: Not on file  Stress: Not on file  Social Connections: Not on file     Family History: The patient's family history includes Cancer in his mother.  ROS:   Please see the history of present illness.    Review of Systems  Constitutional:  Positive for malaise/fatigue.  Respiratory:  Negative for shortness of breath.   Cardiovascular:  Negative for chest pain, palpitations, orthopnea, claudication, leg swelling and PND.  Neurological:  Positive for dizziness. Negative for loss of consciousness.    EKGs/Labs/Other Studies Reviewed:    The following studies were reviewed today: TTE Sep 27, 2021: IMPRESSIONS   1. Left ventricular ejection fraction, by estimation, is 50 to 55%. The  left ventricle  has low normal function. The left ventricle has no regional  wall motion abnormalities. There is mild left ventricular hypertrophy.  Left ventricular diastolic  parameters were normal.   2. Right ventricular systolic function is normal. The right ventricular  size is normal. There is normal pulmonary artery systolic pressure. The  estimated right ventricular systolic pressure is [id number] mmHg.   3. There is a trivial pericardial effusion posterior to the left  ventricle.   4. The mitral valve is grossly normal.  Trivial mitral valve  regurgitation.   5. The aortic valve is tricuspid. Aortic valve regurgitation is not  visualized. No aortic stenosis is present. Aortic valve mean gradient  measures 2.0 mmHg.   6. Aortic dilatation noted. There is mild dilatation of the ascending  aorta, measuring 37 mm.   7. The inferior vena cava is normal in size with greater than 50%  respiratory variability, suggesting right atrial pressure of 3 mmHg.   Comparison(s): No prior Echocardiogram.   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  Recent Lipid Panel No results found for: CHOL, TRIG, HDL, CHOLHDL, VLDL, LDLCALC, LDLDIRECT   Risk Assessment/Calculations:           Physical Exam:    VS:  There were no vitals taken for this visit.    Wt Readings from Last 3 Encounters:  08/01/21 164 lb (74.4 kg)  07/18/21 160 lb (72.6 kg)  12/23/20 166 lb 3.2 oz (75.4 kg)     GEN: Comfortable, NAD HEENT: Normal NECK: No JVD; No carotid bruits CARDIAC: RRR, no murmurs, rubs, gallops RESPIRATORY:  Clear to auscultation without rales, wheezing or rhonchi  ABDOMEN: Soft, non-tender, non-distended. Abdominal binder in place with loose fit MUSCULOSKELETAL:  No edema; No deformity  SKIN: Warm and dry NEUROLOGIC:  AAOx3, stuttering of speech PSYCHIATRIC:  Alert and calm  ASSESSMENT:    No diagnosis found.  PLAN:    In order of problems listed above:  #Orthostatic Hypotension: Thought to be secondary to psychotropic medications and was notably orthostatic in office on 07/18/21. Was recommended for compression stockings and abdominal binder at that time as well as weaning off risperidone if possible. The patient remains on risperidone today and likely due to his underlying psychiatric conditions, will unable to be weaned off per report. He is wearing his compression socks but they are ill-fitting and do not provide any support. He is also  wearing his abdominal binder but again, it is too loose. Discussed with his health aide and with the patient that we need to ensure the compression garments are tight (not uncomfortably so but just enough to provide support). Also encouraged him to increase his fluid intake and liberalize salt intake. -Orthostatics today negative today -Discussed importance of tight fitting compression socks and abdominal binder -Increase hydration -Liberalize salt intake  #Abnormal ECG: ECG with NSR, PACs and RBBB. Asymptomatic. Recommended for myoview and TTE per Dr. Radford Pax. Has not been scheduled yet. Will schedule TTE today and pursue myoview if abnormal. -Check TTE -If TTE concerning, will pursue myoview  #Schizophrenia: -Management per psych -Likely psychotropic meds are contributing to orthostasis, however, unlikely that he can be weaned off. Will try conservative measures for orthostasis at this time as detailed above      Medication Adjustments/Labs and Tests Ordered: Current medicines are reviewed at length with the patient today.  Concerns regarding medicines are outlined above.  No orders of the defined  types were placed in this encounter.  No orders of the defined types were placed in this encounter.   There are no Patient Instructions on file for this visit.   Signed, Freada Bergeron, MD  11/03/2021 3:33 PM    [place]

## 2021-11-08 ENCOUNTER — Encounter: Payer: Self-pay | Admitting: Cardiology

## 2021-11-08 ENCOUNTER — Ambulatory Visit (INDEPENDENT_AMBULATORY_CARE_PROVIDER_SITE_OTHER): Payer: Medicare Other | Admitting: Cardiology

## 2021-11-08 VITALS — BP 104/64 | HR 88 | Ht 74.0 in | Wt 160.0 lb

## 2021-11-08 DIAGNOSIS — I4510 Unspecified right bundle-branch block: Secondary | ICD-10-CM | POA: Diagnosis not present

## 2021-11-08 DIAGNOSIS — I7121 Aneurysm of the ascending aorta, without rupture: Secondary | ICD-10-CM | POA: Diagnosis not present

## 2021-11-08 DIAGNOSIS — I951 Orthostatic hypotension: Secondary | ICD-10-CM | POA: Diagnosis not present

## 2021-11-08 DIAGNOSIS — F209 Schizophrenia, unspecified: Secondary | ICD-10-CM

## 2021-11-08 NOTE — Patient Instructions (Signed)
Medication Instructions:  Your physician recommends that you continue on your current medications as directed. Please refer to the Current Medication list given to you today.   Labwork: None today  Testing/Procedures: None today  Follow-Up: 6 months  Any Other Special Instructions Will Be Listed Below (If Applicable).  If you need a refill on your cardiac medications before your next appointment, please call your pharmacy.  

## 2021-11-14 ENCOUNTER — Other Ambulatory Visit (HOSPITAL_COMMUNITY): Payer: Self-pay | Admitting: Gerontology

## 2021-11-14 ENCOUNTER — Other Ambulatory Visit: Payer: Self-pay | Admitting: Gerontology

## 2021-11-14 DIAGNOSIS — R911 Solitary pulmonary nodule: Secondary | ICD-10-CM

## 2021-12-23 ENCOUNTER — Encounter (HOSPITAL_COMMUNITY): Payer: Self-pay

## 2021-12-23 ENCOUNTER — Ambulatory Visit (HOSPITAL_COMMUNITY): Admission: RE | Admit: 2021-12-23 | Payer: Medicare Other | Source: Ambulatory Visit

## 2021-12-26 ENCOUNTER — Telehealth: Payer: Self-pay | Admitting: *Deleted

## 2021-12-26 NOTE — Telephone Encounter (Signed)
[facility] faxed over an order for Dr. Shari Prows to okay and sign off to discontinue abdominal binder and compression hose on this pt, due to non use. ?Discontinuation order written and signed by Dr. Shari Prows, for the okay for facility to discontinue abdominal binder and compressions for this pt due to non use. ?Order faxed back to [facility] at [phone]. 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   ?

## 2022-05-04 NOTE — Progress Notes (Signed)
Cardiology Office Note:    Date:  05/11/2022   ID:  Louis Riley, DOB 09/16/1955, MRN [id number]  PCP:  Carrolyn Meiers, MD   [facility] Providers Cardiologist:  None {  Referring MD: Carrolyn Meiers*    History of Present Illness:    Louis Riley is a 66 y.o. male with a hx of chronic anemia, chronic bronchitis, and schizophrenia who presents to clinic for follow-up.  Patient was seen by Fransico Him, MD on 07/18/21 for abnormal ECG. Specifically ECG showed NSR with PACs and RBBB. Was also having episodes of hypotension at home. No dizziness but had associated fatigue. Notably was orthostatic during that visit thought to be due to psychotropic medications. Was recommended to switch off risperidone if possible. Recommended compression hose and abdominal binder. He was also recommended for TTE which showed LVEF 50-55%, mild LVH, normal RV, trivial pericardial effusion, borderline ascending aortic aneurysm of 51mm.  He was seen in follow-up on 08/01/22 where his symptoms overall had improved. BP remained soft in 90/70s. He had been maintained on risperidone. He had been wearing the compression socks and abdominal binder, but they were too loose. Otherwise, he was feeling well. Orthostatics were negative.  Was last seen in clinic on 10/2021 where he was doing better. Did not want to wear the abdominal binder or compression socks and his orthostasis had overall improved.   Today, the patient states he is feeling well. No chest pain, SOB, LE edema, orthopnea or PND. Per nurse who has accompanied him to his visit, no episodes of low blood pressure or falls. He continues to smoke about 1.5 packs per week.   Past Medical History:  Diagnosis Date   Anemia    Chronic bronchitis (HCC)    Schizophrenia (HCC)     Past Surgical History:  Procedure Laterality Date   KNEE SURGERY      Current Medications: Current Meds  Medication Sig   ASPIRIN LOW DOSE 81 MG EC tablet  Take 81 mg by mouth daily.   benztropine (COGENTIN) 1 MG tablet Take 1 mg by mouth 2 (two) times daily.   brimonidine (ALPHAGAN) 0.2 % ophthalmic solution Place 1 drop into both eyes 2 (two) times daily.   FEROSUL 325 (65 Fe) MG tablet Take 325 mg by mouth daily.   haloperidol (HALDOL) 2 MG/ML solution Take 2 mg by mouth 3 (three) times daily. 73ml   melatonin 5 MG TABS Take 10 mg by mouth at bedtime.   risperidone (RISPERDAL) 4 MG tablet Take 4 mg by mouth daily.   Sodium Phosphates (ENEMA RE) Use per colon prep instructions   VENTOLIN HFA 108 (90 Base) MCG/ACT inhaler Inhale 1 puff into the lungs 4 (four) times daily as needed for wheezing or shortness of breath.     Allergies:   Patient has no known allergies.   Social History   Socioeconomic History   Marital status: Single    Spouse name: Not on file   Number of children: Not on file   Years of education: Not on file   Highest education level: Not on file  Occupational History   Not on file  Tobacco Use   Smoking status: Every Day    Packs/day: 0.25    Types: Cigarettes   Smokeless tobacco: Never  Vaping Use   Vaping Use: Never used  Substance and Sexual Activity   Alcohol use: Never   Drug use: Never   Sexual activity: Not on file  Other Topics Concern  Not on file  Social History Narrative   Not on file   Social Determinants of Health   Financial Resource Strain: Not on file  Food Insecurity: Not on file  Transportation Needs: Not on file  Physical Activity: Not on file  Stress: Not on file  Social Connections: Not on file     Family History: The patient's family history includes Cancer in his mother.  ROS:   Please see the history of present illness.    Review of Systems  Constitutional:  Negative for malaise/fatigue.  Respiratory:  Negative for shortness of breath.   Cardiovascular:  Negative for chest pain, palpitations, orthopnea, claudication, leg swelling and PND.  Musculoskeletal:  Negative for  falls.  Neurological:  Negative for dizziness and loss of consciousness.     EKGs/Labs/Other Studies Reviewed:    The following studies were reviewed today: TTE 09/27/2021: IMPRESSIONS   1. Left ventricular ejection fraction, by estimation, is 50 to 55%. The  left ventricle has low normal function. The left ventricle has no regional  wall motion abnormalities. There is mild left ventricular hypertrophy.  Left ventricular diastolic  parameters were normal.   2. Right ventricular systolic function is normal. The right ventricular  size is normal. There is normal pulmonary artery systolic pressure. The  estimated right ventricular systolic pressure is 24.7 mmHg.   3. There is a trivial pericardial effusion posterior to the left  ventricle.   4. The mitral valve is grossly normal. Trivial mitral valve  regurgitation.   5. The aortic valve is tricuspid. Aortic valve regurgitation is not  visualized. No aortic stenosis is present. Aortic valve mean gradient  measures 2.0 mmHg.   6. Aortic dilatation noted. There is mild dilatation of the ascending  aorta, measuring 37 mm.   7. The inferior vena cava is normal in size with greater than 50%  respiratory variability, suggesting right atrial pressure of 3 mmHg.   Comparison(s): No prior Echocardiogram.   EKG:  EKG 05/11/22: NSR, RBBB, LAFB  Recent Labs: No results found for requested labs within last 365 days.  Recent Lipid Panel No results found for: "CHOL", "TRIG", "HDL", "CHOLHDL", "VLDL", "LDLCALC", "LDLDIRECT"   Risk Assessment/Calculations:           Physical Exam:    VS:  BP 118/86   Pulse 68   Ht 6\' 1"  (1.854 m)   Wt 155 lb 12.8 oz (70.7 kg)   SpO2 99%   BMI 20.56 kg/m     Wt Readings from Last 3 Encounters:  05/11/22 155 lb 12.8 oz (70.7 kg)  11/08/21 160 lb (72.6 kg)  08/01/21 164 lb (74.4 kg)     GEN: Well appearing, NAD HEENT: Normal NECK: No JVD; No carotid bruits CARDIAC: RRR, no murmurs RESPIRATORY:   Diminished but clear ABDOMEN: Soft, non-tender, non-distended. MUSCULOSKELETAL:  No edema; No deformity  SKIN: Warm and dry NEUROLOGIC:  AAOx3, stuttering of speech PSYCHIATRIC:  Alert and calm  ASSESSMENT:    1. Orthostatic hypotension   2. Aneurysm of ascending aorta without rupture (HCC)   3. Schizophrenia, unspecified type (HCC)   4. RBBB     PLAN:    In order of problems listed above:  #Orthostatic Hypotension: Improved with no episodes of dizziness or hypotension per report. Off compression therapy for now as he did not like wearing it daily. Encouraged him to increase his fluid intake and liberalize salt intake. -Okay to use compression garments as needed -Increase hydration -Liberalize salt intake -Monitor  blood pressures and symptoms with nursing staff  #Abnormal ECG: ECG with NSR, PACs and RBBB. Asymptomatic. TTE with LVEF 50-55%, normal RV function, normal PASP, no significant valve disease. Ascending aorta 20mm. -Reassuring TTE with resolution of symptoms -Continue to monitor  #Schizophrenia: -Management per psych -Likely psychotropic meds are contributing to episodes of orthostasis, however, unlikely that he can be weaned off. Will try conservative measures for orthostasis at this time as detailed above  #Borderline Ascending Aortic Aneurysm: Measured 75mm on TTE 08/2021. Will continue serial monitoring with TTEs.      Medication Adjustments/Labs and Tests Ordered: Current medicines are reviewed at length with the patient today.  Concerns regarding medicines are outlined above.  Orders Placed This Encounter  Procedures   EKG 12-Lead   ECHOCARDIOGRAM COMPLETE   No orders of the defined types were placed in this encounter.    Patient Instructions  Medication Instructions:  Your physician recommends that you continue on your current medications as directed. Please refer to the Current Medication list given to you  today.   Labwork: None  Testing/Procedures: Your physician has requested that you have an echocardiogram. Echocardiography is a painless test that uses sound waves to create images of your heart. It provides your doctor with information about the size and shape of your heart and how well your heart's chambers and valves are working. This procedure takes approximately one hour. There are no restrictions for this procedure.   Follow-Up: Follow up with Dr. Shari Prows in 1 year.   Any Other Special Instructions Will Be Listed Below (If Applicable).     If you need a refill on your cardiac medications before your next appointment, please call your pharmacy.    Signed, Meriam Sprague, MD  05/11/2022 2:09 PM    [facility]

## 2022-05-11 ENCOUNTER — Ambulatory Visit: Payer: Medicare Other | Attending: Cardiology | Admitting: Cardiology

## 2022-05-11 ENCOUNTER — Encounter: Payer: Self-pay | Admitting: Cardiology

## 2022-05-11 VITALS — BP 118/86 | HR 68 | Ht 73.0 in | Wt 155.8 lb

## 2022-05-11 DIAGNOSIS — I7121 Aneurysm of the ascending aorta, without rupture: Secondary | ICD-10-CM | POA: Diagnosis present

## 2022-05-11 DIAGNOSIS — I4510 Unspecified right bundle-branch block: Secondary | ICD-10-CM | POA: Diagnosis present

## 2022-05-11 DIAGNOSIS — F209 Schizophrenia, unspecified: Secondary | ICD-10-CM | POA: Diagnosis present

## 2022-05-11 DIAGNOSIS — I951 Orthostatic hypotension: Secondary | ICD-10-CM

## 2022-05-11 NOTE — Patient Instructions (Signed)
Medication Instructions:  Your physician recommends that you continue on your current medications as directed. Please refer to the Current Medication list given to you today.   Labwork: None  Testing/Procedures: Your physician has requested that you have an echocardiogram. Echocardiography is a painless test that uses sound waves to create images of your heart. It provides your doctor with information about the size and shape of your heart and how well your heart's chambers and valves are working. This procedure takes approximately one hour. There are no restrictions for this procedure.   Follow-Up: Follow up with Dr. Shari Prows in 1 year.   Any Other Special Instructions Will Be Listed Below (If Applicable).     If you need a refill on your cardiac medications before your next appointment, please call your pharmacy.

## 2022-06-14 ENCOUNTER — Ambulatory Visit (HOSPITAL_COMMUNITY)
Admission: RE | Admit: 2022-06-14 | Discharge: 2022-06-14 | Disposition: A | Discharge status: Home / Self Care | Payer: Medicare Other | Source: Ambulatory Visit | Attending: Gerontology | Admitting: Gerontology

## 2022-06-14 ENCOUNTER — Other Ambulatory Visit (HOSPITAL_COMMUNITY): Payer: Self-pay | Admitting: Gerontology

## 2022-06-14 DIAGNOSIS — R0989 Other specified symptoms and signs involving the circulatory and respiratory systems: Secondary | ICD-10-CM | POA: Insufficient documentation

## 2022-06-14 DIAGNOSIS — R059 Cough, unspecified: Secondary | ICD-10-CM | POA: Diagnosis present

## 2022-09-11 ENCOUNTER — Ambulatory Visit (HOSPITAL_COMMUNITY)
Admission: RE | Admit: 2022-09-11 | Discharge: 2022-09-11 | Disposition: A | Discharge status: Home / Self Care | Payer: Medicare Other | Source: Ambulatory Visit | Attending: Cardiology | Admitting: Cardiology

## 2022-09-11 ENCOUNTER — Telehealth: Payer: Self-pay

## 2022-09-11 DIAGNOSIS — I7121 Aneurysm of the ascending aorta, without rupture: Secondary | ICD-10-CM | POA: Diagnosis present

## 2022-09-11 LAB — ECHOCARDIOGRAM COMPLETE
Area-P 1/2: 3.77 cm2
S' Lateral: 2.9 cm

## 2022-09-11 NOTE — Telephone Encounter (Signed)
Spoke with Baker Janus, nurse at [facility], who verbalized understanding.

## 2022-09-11 NOTE — Progress Notes (Signed)
*  PRELIMINARY RESULTS* Echocardiogram 2D Echocardiogram has been performed.  Louis Riley 09/11/2022, 9:12 AM

## 2022-09-11 NOTE — Telephone Encounter (Signed)
-----  Message from Freada Bergeron, MD sent at 09/11/2022  3:48 PM EST ----- His echo shows normal pumping function and no significant valve disease. His aorta remains mildly dilated at 23mm. Will need continued annual echoes for monitoring

## 2023-11-30 ENCOUNTER — Other Ambulatory Visit (HOSPITAL_COMMUNITY): Payer: Self-pay | Admitting: Gerontology

## 2023-11-30 DIAGNOSIS — R911 Solitary pulmonary nodule: Secondary | ICD-10-CM

## 2023-12-13 ENCOUNTER — Ambulatory Visit (HOSPITAL_COMMUNITY)
Admission: RE | Admit: 2023-12-13 | Discharge: 2023-12-13 | Disposition: A | Discharge status: Home / Self Care | Source: Ambulatory Visit | Attending: Gerontology | Admitting: Gerontology

## 2023-12-13 DIAGNOSIS — R911 Solitary pulmonary nodule: Secondary | ICD-10-CM | POA: Insufficient documentation

## 2024-05-28 ENCOUNTER — Ambulatory Visit (HOSPITAL_COMMUNITY)
Admission: RE | Admit: 2024-05-28 | Discharge: 2024-05-28 | Disposition: A | Discharge status: Home / Self Care | Source: Ambulatory Visit | Attending: Gerontology | Admitting: Gerontology

## 2024-05-28 ENCOUNTER — Other Ambulatory Visit (HOSPITAL_COMMUNITY): Payer: Self-pay | Admitting: Gerontology

## 2024-05-28 DIAGNOSIS — M25552 Pain in left hip: Secondary | ICD-10-CM

## 2024-05-28 DIAGNOSIS — M25551 Pain in right hip: Secondary | ICD-10-CM | POA: Insufficient documentation

## 2024-06-18 ENCOUNTER — Encounter: Payer: Self-pay | Admitting: Orthopedic Surgery

## 2024-06-18 ENCOUNTER — Ambulatory Visit (INDEPENDENT_AMBULATORY_CARE_PROVIDER_SITE_OTHER): Admitting: Orthopedic Surgery

## 2024-06-18 ENCOUNTER — Other Ambulatory Visit (INDEPENDENT_AMBULATORY_CARE_PROVIDER_SITE_OTHER)

## 2024-06-18 VITALS — BP 108/76 | HR 144 | Ht 73.0 in | Wt 155.0 lb

## 2024-06-18 DIAGNOSIS — M898X5 Other specified disorders of bone, thigh: Secondary | ICD-10-CM

## 2024-06-18 DIAGNOSIS — S72101S Unspecified trochanteric fracture of right femur, sequela: Secondary | ICD-10-CM | POA: Diagnosis not present

## 2024-06-18 DIAGNOSIS — M2170 Unequal limb length (acquired), unspecified site: Secondary | ICD-10-CM | POA: Diagnosis not present

## 2024-06-18 DIAGNOSIS — M545 Low back pain, unspecified: Secondary | ICD-10-CM

## 2024-06-18 NOTE — Patient Instructions (Signed)
 The patient complained of no hip pain in the office and had a normal exam except for leg length discrepancy on the right  X-rays were done of his hip femur and spine  Recommend he continue Tylenol for pain  Recommend half inch insert right shoe  No further treatment needed  Hip fracture healed normally except for the shortening of the limb

## 2024-06-18 NOTE — Progress Notes (Signed)
  Intake history:  Chief Complaint  Patient presents with   Hip Pain    Bilateral      BP 108/76   Pulse (!) 144   Ht 6' 1 (1.854 m)   Wt 155 lb (70.3 kg)   BMI 20.45 kg/m  Body mass index is 20.45 kg/m.  Pharmacy? _____in Facility _________________________________  WHAT ARE WE SEEING YOU FOR TODAY?   Hips / right hip painful   How long has this bothered you? (DOI?DOS?WS?)  Unknown, several months   Was there an injury? No  Anticoag.  No  Diabetes No  Heart disease Yes  Hypertension No  SMOKING HX Yes  Kidney disease No  Any ALLERGIES ___________No Known Allergies ___________________________________   Treatment:  Have you taken:  Tylenol Yes  Advil Yes  Had PT No  Had injection No  Other  ____________previous surgery_____________

## 2024-06-18 NOTE — Progress Notes (Signed)
   Medical history including allergies medications family history reviewed prior to visit   Chief Complaint  Patient presents with   Hip Pain    Bilateral    68 year old male lives in a facility unclear etiology of his right hip fracture or who did the surgery The patient has memory loss  He tells me that his head hurts  No records are regarding his hip fracture and he does not remember having  Examination of the right lower extremity shows a half to three-quarter inch leg length discrepancy  When flexing his hip he has no pain  There are no signs of infection  DG Lumbar Spine 2-3 Views Result Date: 06/18/2024 Lumbar spine imaging Patient plaints of hip pain X-rays shows abnormal coronal plane alignment with a slight curve in the spine there is flattening of the lumbar lordosis as well.  There is a compression fracture deformity of L5. Disc spaces are preserved throughout there is also some endplate irregularities as well. Compression fracture type deformity of L5 age-indeterminate disc spaces otherwise preserved slight loss of lordosis and mild curve on the AP x-ray   DG FEMUR, MIN 2 VIEWS RIGHT Result Date: 06/18/2024 Images right femur patient with previous hip nail. Prior images do not show the complete extent of the nail This appears to be from a previous peritrochanteric fracture with good position of the nail throughout there is no distal locking screw There is a significant amount of heterotopic bone along the inferomedial section of the femur and the greater trochanter.  There is no evidence of avascular necrosis and for the most part in the weightbearing region the joint space is preserved.  There is some sharpening of the lateral margin of the acetabulum but there appears to be no other degenerative changes Impression trochanteric nail, long nail cephalomedullary type no distal locking screw heterotopic bone proximal femur no significant arthritic changes Note fracture did heal  appropriately     Encounter Diagnoses  Name Primary?   Pain in right femur    Lumbar pain    Closed pertrochanteric fracture of right femur, sequela    Acquired leg length discrepancy Yes    Half inch shoe insert right side  Return as needed
